# Patient Record
Sex: Male | Born: 1960 | Race: White | Hispanic: No | Marital: Married | Smoking: Never smoker
Health system: Southern US, Community
[De-identification: ages and names within clinical notes are randomized; demographics above are authoritative.]

## PROBLEM LIST (undated history)

## (undated) DIAGNOSIS — Z950 Presence of cardiac pacemaker: Secondary | ICD-10-CM

## (undated) DIAGNOSIS — E785 Hyperlipidemia, unspecified: Secondary | ICD-10-CM

## (undated) DIAGNOSIS — E079 Disorder of thyroid, unspecified: Secondary | ICD-10-CM

## (undated) DIAGNOSIS — E119 Type 2 diabetes mellitus without complications: Secondary | ICD-10-CM

## (undated) DIAGNOSIS — E7800 Pure hypercholesterolemia, unspecified: Secondary | ICD-10-CM

## (undated) DIAGNOSIS — I10 Essential (primary) hypertension: Secondary | ICD-10-CM

## (undated) HISTORY — PX: ABDOMINAL SURGERY: SHX537

## (undated) HISTORY — PX: PACEMAKER INSERTION: SHX728

## (undated) HISTORY — PX: APPENDECTOMY: SHX54

## (undated) HISTORY — PX: HAND SURGERY: SHX662

---

## 2010-03-25 ENCOUNTER — Emergency Department (HOSPITAL_BASED_OUTPATIENT_CLINIC_OR_DEPARTMENT_OTHER): Admission: EM | Admit: 2010-03-25 | Discharge: 2010-03-25 | Payer: Self-pay | Admitting: Emergency Medicine

## 2010-03-25 ENCOUNTER — Ambulatory Visit: Payer: Self-pay | Admitting: Radiology

## 2011-12-19 IMAGING — CR DG ANKLE COMPLETE 3+V*R*
3 series · 3 of 3 positions shown · non-contrast
Comparison: None.

CLINICAL DATA: Twisted right ankle, pain

RIGHT ANKLE - COMPLETE 3+ VIEW

[t ankle joint ap right]
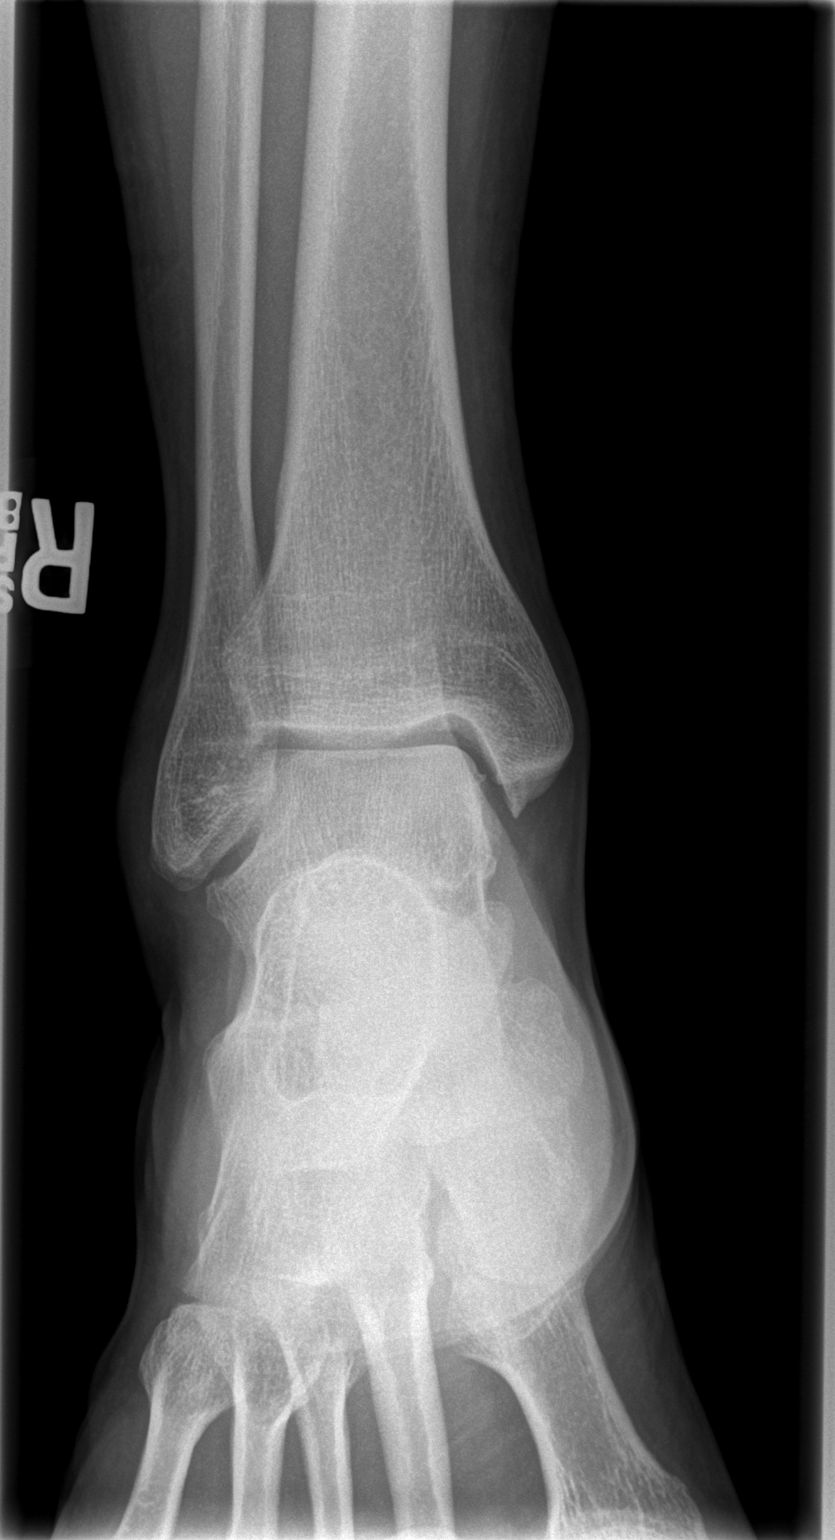

[t ankle joint oblique right]
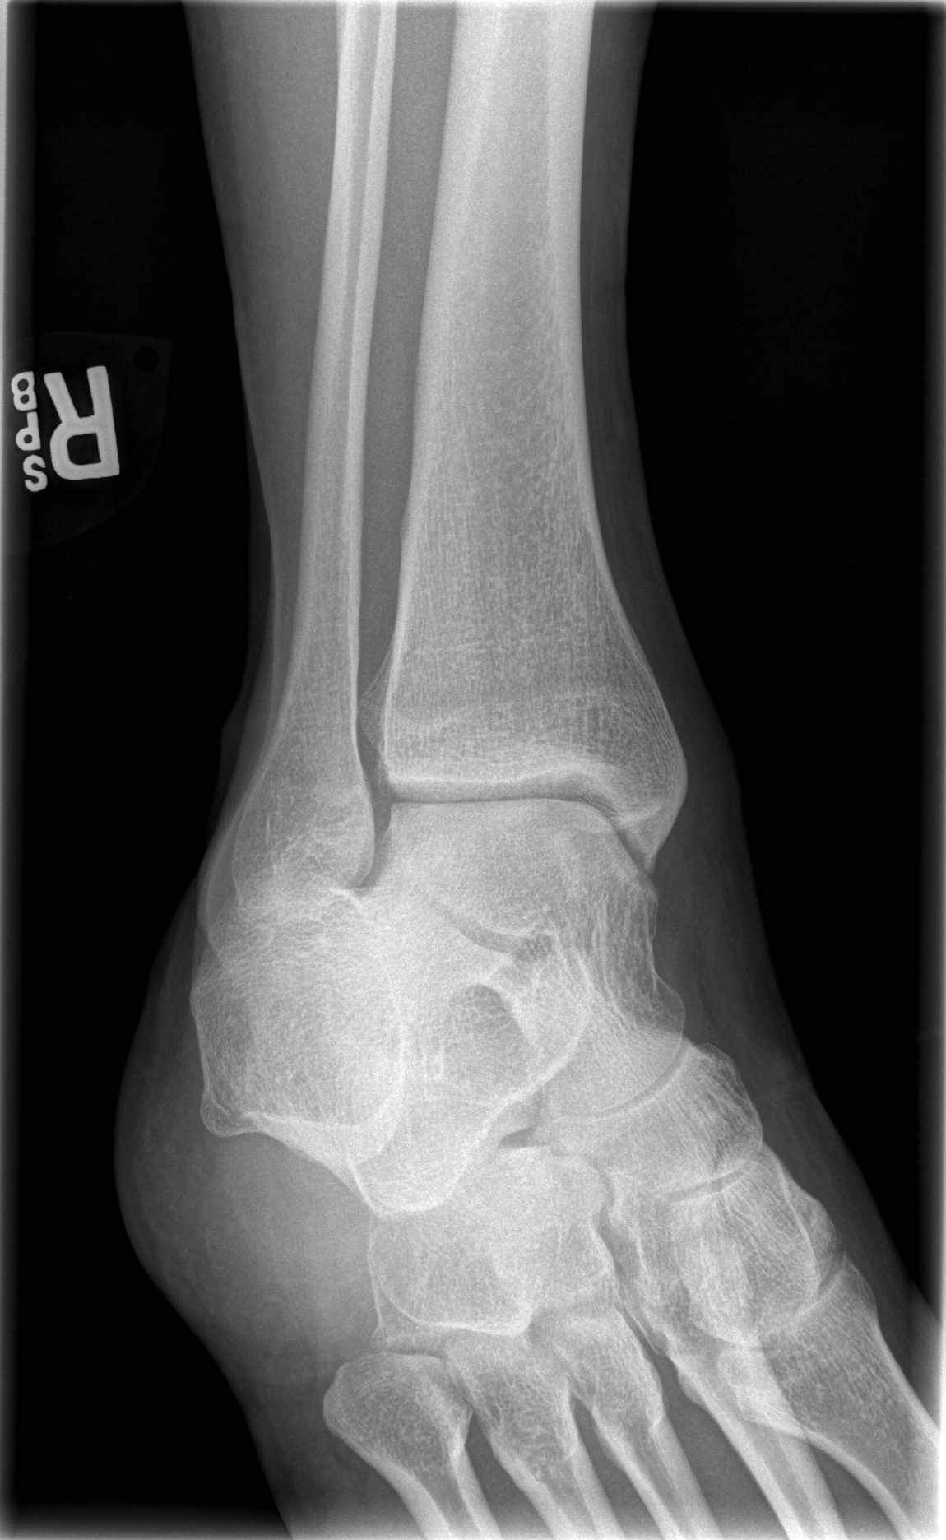

[t ankle joint lat right]
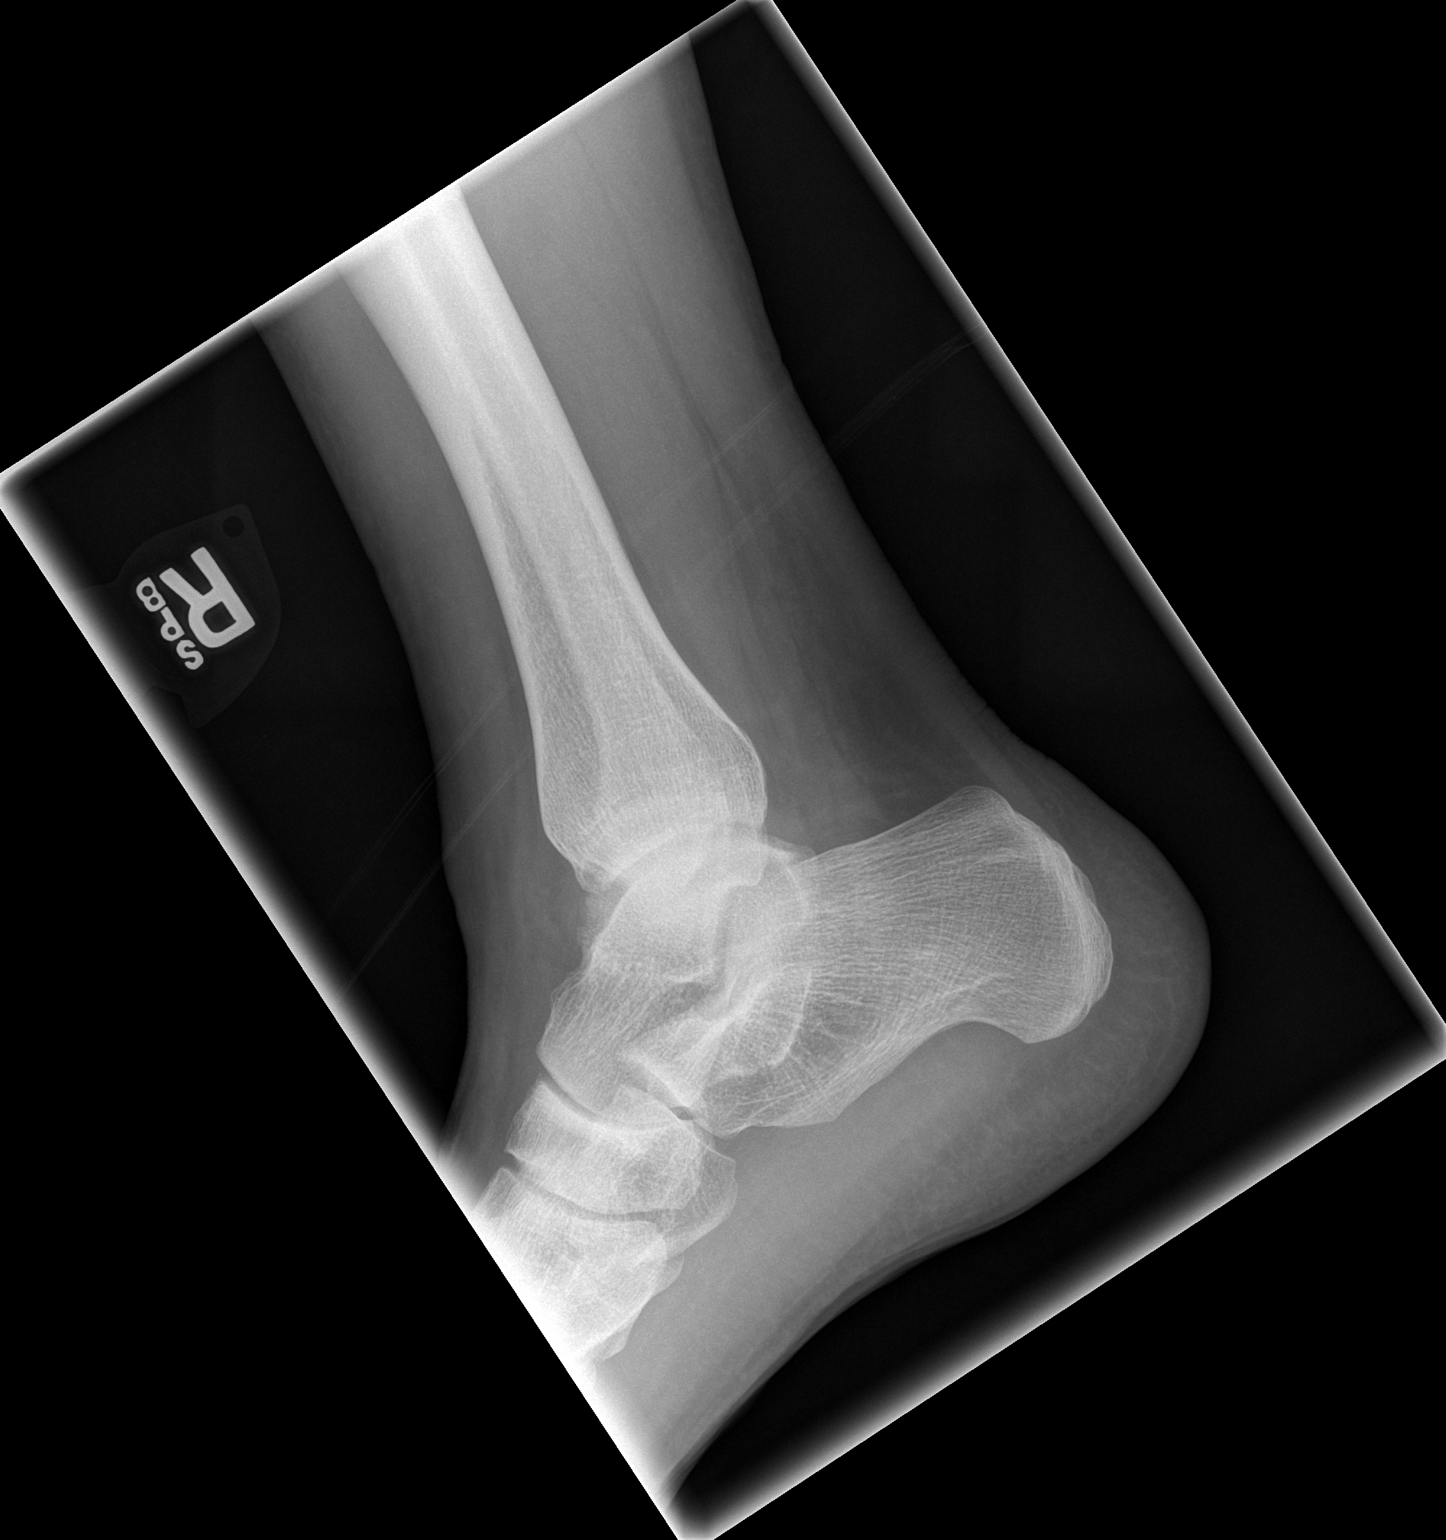

[3 of 3 positions shown; findings below may reference images not displayed]

FINDINGS: Mild soft tissue swelling.  No fracture or dislocation.
IMPRESSION: As above.

## 2014-09-17 ENCOUNTER — Encounter (HOSPITAL_BASED_OUTPATIENT_CLINIC_OR_DEPARTMENT_OTHER): Payer: Self-pay | Admitting: Emergency Medicine

## 2014-09-17 ENCOUNTER — Emergency Department (HOSPITAL_BASED_OUTPATIENT_CLINIC_OR_DEPARTMENT_OTHER)
Admission: EM | Admit: 2014-09-17 | Discharge: 2014-09-17 | Disposition: A | Discharge status: Home / Self Care | Payer: BC Managed Care – PPO | Attending: Emergency Medicine | Admitting: Emergency Medicine

## 2014-09-17 DIAGNOSIS — Z950 Presence of cardiac pacemaker: Secondary | ICD-10-CM | POA: Diagnosis not present

## 2014-09-17 DIAGNOSIS — I10 Essential (primary) hypertension: Secondary | ICD-10-CM | POA: Diagnosis not present

## 2014-09-17 DIAGNOSIS — E079 Disorder of thyroid, unspecified: Secondary | ICD-10-CM | POA: Insufficient documentation

## 2014-09-17 DIAGNOSIS — Z79899 Other long term (current) drug therapy: Secondary | ICD-10-CM | POA: Insufficient documentation

## 2014-09-17 DIAGNOSIS — M25511 Pain in right shoulder: Secondary | ICD-10-CM | POA: Diagnosis present

## 2014-09-17 DIAGNOSIS — E119 Type 2 diabetes mellitus without complications: Secondary | ICD-10-CM | POA: Insufficient documentation

## 2014-09-17 DIAGNOSIS — M542 Cervicalgia: Secondary | ICD-10-CM | POA: Diagnosis not present

## 2014-09-17 DIAGNOSIS — E780 Pure hypercholesterolemia: Secondary | ICD-10-CM | POA: Insufficient documentation

## 2014-09-17 HISTORY — DX: Type 2 diabetes mellitus without complications: E11.9

## 2014-09-17 HISTORY — DX: Disorder of thyroid, unspecified: E07.9

## 2014-09-17 HISTORY — DX: Presence of cardiac pacemaker: Z95.0

## 2014-09-17 HISTORY — DX: Pure hypercholesterolemia, unspecified: E78.00

## 2014-09-17 HISTORY — DX: Essential (primary) hypertension: I10

## 2014-09-17 MED ORDER — HYDROCODONE-ACETAMINOPHEN 5-325 MG PO TABS: 1.0000 | ORAL_TABLET | ORAL | Status: AC | PRN

## 2014-09-17 NOTE — ED Provider Notes (Signed)
CSN: [id number]     Arrival date & time 09/17/14  1708 History   First MD Initiated Contact with Patient 09/17/14 1720     Chief Complaint  Patient presents with  . Shoulder Pain     (Consider location/radiation/quality/duration/timing/severity/associated sxs/prior Treatment) Patient is a [age] y.o. male presenting with shoulder pain. The history is provided by the patient. No language interpreter was used.  Shoulder Pain This is a new problem. The current episode started today. The problem occurs constantly. Pertinent negatives include no chest pain, chills, fever, numbness or weakness. Associated symptoms comments: Right shoulder pain without known injury that started this morning. Pain affects the paracervical neck, radiates to parascapular area and along superior shoulder. No weakness, numbness or swelling. He denies chest pain, SOB or headache. The pain is worse with movement and better with rest. .    Past Medical History  Diagnosis Date  . Pacemaker   . Hypertension   . Diabetes mellitus without complication   . Hypercholesteremia   . Thyroid disease    Past Surgical History  Procedure Laterality Date  . Appendectomy    . Abdominal surgery    . Hand surgery     History reviewed. No pertinent family history. History  Substance Use Topics  . Smoking status: Never Smoker   . Smokeless tobacco: Not on file  . Alcohol Use: No    Review of Systems  Constitutional: Negative for fever and chills.  Respiratory: Negative.  Negative for shortness of breath.   Cardiovascular: Negative.  Negative for chest pain.  Musculoskeletal:       See HPI  Skin: Negative.   Neurological: Negative.  Negative for weakness and numbness.      Allergies  Erythromycin  Home Medications   Prior to Admission medications   Medication Sig Start Date End Date Taking? Authorizing Provider  glipiZIDE (GLUCOTROL) 10 MG tablet Take 10 mg by mouth daily before breakfast.   Yes Historical  Provider, MD  lansoprazole (PREVACID) 30 MG capsule Take 30 mg by mouth daily at 12 noon.   Yes Historical Provider, MD  Levothyroxine Sodium (SYNTHROID PO) Take by mouth.   Yes Historical Provider, MD  lisinopril (PRINIVIL,ZESTRIL) 40 MG tablet Take 40 mg by mouth daily.   Yes Historical Provider, MD  metFORMIN (GLUMETZA) 1000 MG (MOD) 24 hr tablet Take 1,000 mg by mouth 2 (two) times daily with a meal.   Yes Historical Provider, MD  pravastatin (PRAVACHOL) 40 MG tablet Take 40 mg by mouth daily.   Yes Historical Provider, MD   BP 127/86  Pulse 71  Temp(Src) 98.5 F (36.9 C) (Oral)  Resp 18  SpO2 100% Physical Exam  Constitutional: He is oriented to person, place, and time. He appears well-developed and well-nourished.  Neck: Normal range of motion.  Pulmonary/Chest: Effort normal.  Abdominal: Soft.  Musculoskeletal: Normal range of motion.  Minimal tenderness to palpation of right paracervical neck and shoulder. No swelling, discoloration or deformity. FROM. 5/5 strength of right UE.  Neurological: He is alert and oriented to person, place, and time. Coordination normal.  Skin: Skin is warm and dry.  Psychiatric: He has a normal mood and affect.    ED Course  Procedures (including critical care time) Labs Review Labs Reviewed - No data to display  Imaging Review No results found.   EKG Interpretation None      MDM   Final diagnoses:  None    1. Right shoulder pain  Muscular pain vs radiculopathy,  favor muscular pain. No neurologic deficits. Will treat symptomatically and encourage ortho follow up.    Arnoldo HookerShari A Alice Vitelli, PA-C 09/17/14 1802  Rolland PorterMark James, MD 09/24/14 [phone]

## 2014-09-17 NOTE — ED Notes (Signed)
Pt reports right shoulder pain that started today and is worse when he moves his arm

## 2014-09-17 NOTE — ED Notes (Signed)
PA at bedside.

## 2014-09-17 NOTE — Discharge Instructions (Signed)
Shoulder Pain  The shoulder is the joint that connects your arms to your body. The bones that form the shoulder joint include the upper arm bone (humerus), the shoulder blade (scapula), and the collarbone (clavicle). The top of the humerus is shaped like a ball and fits into a rather flat socket on the scapula (glenoid cavity). A combination of muscles and strong, fibrous tissues that connect muscles to bones (tendons) support your shoulder joint and hold the ball in the socket. Small, fluid-filled sacs (bursae) are located in different areas of the joint. They act as cushions between the bones and the overlying soft tissues and help reduce friction between the gliding tendons and the bone as you move your arm. Your shoulder joint allows a wide range of motion in your arm. This range of motion allows you to do things like scratch your back or throw a ball. However, this range of motion also makes your shoulder more prone to pain from overuse and injury.  Causes of shoulder pain can originate from both injury and overuse and usually can be grouped in the following four categories:   Redness, swelling, and pain (inflammation) of the tendon (tendinitis) or the bursae (bursitis).   Instability, such as a dislocation of the joint.   Inflammation of the joint (arthritis).   Broken bone (fracture).  HOME CARE INSTRUCTIONS    Apply ice to the sore area.   Put ice in a plastic bag.   Place a towel between your skin and the bag.   Leave the ice on for 15-20 minutes, 3-4 times per day for the first 2 days, or as directed by your health care provider.   Stop using cold packs if they do not help with the pain.   If you have a shoulder sling or immobilizer, wear it as long as your caregiver instructs. Only remove it to shower or bathe. Move your arm as little as possible, but keep your hand moving to prevent swelling.   Squeeze a soft ball or foam pad as much as possible to help prevent swelling.   Only take  over-the-counter or prescription medicines for pain, discomfort, or fever as directed by your caregiver.  SEEK MEDICAL CARE IF:    Your shoulder pain increases, or new pain develops in your arm, hand, or fingers.   Your hand or fingers become cold and numb.   Your pain is not relieved with medicines.  SEEK IMMEDIATE MEDICAL CARE IF:    Your arm, hand, or fingers are numb or tingling.   Your arm, hand, or fingers are significantly swollen or turn white or blue.  MAKE SURE YOU:    Understand these instructions.   Will watch your condition.   Will get help right away if you are not doing well or get worse.  Document Released: 08/15/2005 Document Revised: 03/22/2014 Document Reviewed: 10/20/2011  Exit[employer] Patient Information 2015 [employer]. This information is not intended to replace advice given to you by your health care provider. Make sure you discuss any questions you have with your health care provider.  Cryotherapy  Cryotherapy means treatment with cold. Ice or gel packs can be used to reduce both pain and swelling. Ice is the most helpful within the first 24 to 48 hours after an injury or flare-up from overusing a muscle or joint. Sprains, strains, spasms, burning pain, shooting pain, and aches can all be eased with ice. Ice can also be used when recovering from surgery. Ice is effective, has   very few side effects, and is safe for most people to use.  PRECAUTIONS   Ice is not a safe treatment option for people with:   Raynaud phenomenon. This is a condition affecting small blood vessels in the extremities. Exposure to cold may cause your problems to return.   Cold hypersensitivity. There are many forms of cold hypersensitivity, including:   Cold urticaria. Red, itchy hives appear on the skin when the tissues begin to warm after being iced.   Cold erythema. This is a red, itchy rash caused by exposure to cold.   Cold hemoglobinuria. Red blood cells break down when the tissues begin to warm after  being iced. The hemoglobin that carry oxygen are passed into the urine because they cannot combine with blood proteins fast enough.   Numbness or altered sensitivity in the area being iced.  If you have any of the following conditions, do not use ice until you have discussed cryotherapy with your caregiver:   Heart conditions, such as arrhythmia, angina, or chronic heart disease.   High blood pressure.   Healing wounds or open skin in the area being iced.   Current infections.   Rheumatoid arthritis.   Poor circulation.   Diabetes.  Ice slows the blood flow in the region it is applied. This is beneficial when trying to stop inflamed tissues from spreading irritating chemicals to surrounding tissues. However, if you expose your skin to cold temperatures for too long or without the proper protection, you can damage your skin or nerves. Watch for signs of skin damage due to cold.  HOME CARE INSTRUCTIONS  Follow these tips to use ice and cold packs safely.   Place a dry or damp towel between the ice and skin. A damp towel will cool the skin more quickly, so you may need to shorten the time that the ice is used.   For a more rapid response, add gentle compression to the ice.   Ice for no more than 10 to 20 minutes at a time. The bonier the area you are icing, the less time it will take to get the benefits of ice.   Check your skin after 5 minutes to make sure there are no signs of a poor response to cold or skin damage.   Rest 20 minutes or more between uses.   Once your skin is numb, you can end your treatment. You can test numbness by very lightly touching your skin. The touch should be so light that you do not see the skin dimple from the pressure of your fingertip. When using ice, most people will feel these normal sensations in this order: cold, burning, aching, and numbness.   Do not use ice on someone who cannot communicate their responses to pain, such as small children or people with  dementia.  HOW TO MAKE AN ICE PACK  Ice packs are the most common way to use ice therapy. Other methods include ice massage, ice baths, and cryosprays. Muscle creams that cause a cold, tingly feeling do not offer the same benefits that ice offers and should not be used as a substitute unless recommended by your caregiver.  To make an ice pack, do one of the following:   Place crushed ice or a bag of frozen vegetables in a sealable plastic bag. Squeeze out the excess air. Place this bag inside another plastic bag. Slide the bag into a pillowcase or place a damp towel between your skin and the bag.     Mix 3 parts water with 1 part rubbing alcohol. Freeze the mixture in a sealable plastic bag. When you remove the mixture from the freezer, it will be slushy. Squeeze out the excess air. Place this bag inside another plastic bag. Slide the bag into a pillowcase or place a damp towel between your skin and the bag.  SEEK MEDICAL CARE IF:   You develop white spots on your skin. This may give the skin a blotchy (mottled) appearance.   Your skin turns blue or pale.   Your skin becomes waxy or hard.   Your swelling gets worse.  MAKE SURE YOU:    Understand these instructions.   Will watch your condition.   Will get help right away if you are not doing well or get worse.  Document Released: 07/02/2011 Document Revised: 03/22/2014 Document Reviewed: 07/02/2011  [employer] Patient Information 2015 [employer]. This information is not intended to replace advice given to you by your health care provider. Make sure you discuss any questions you have with your health care provider.

## 2015-07-12 ENCOUNTER — Other Ambulatory Visit: Payer: Self-pay

## 2015-07-12 ENCOUNTER — Emergency Department (HOSPITAL_BASED_OUTPATIENT_CLINIC_OR_DEPARTMENT_OTHER)
Admission: EM | Admit: 2015-07-12 | Discharge: 2015-07-12 | Disposition: A | Discharge status: Home / Self Care | Payer: BLUE CROSS/BLUE SHIELD | Attending: Emergency Medicine | Admitting: Emergency Medicine

## 2015-07-12 ENCOUNTER — Encounter (HOSPITAL_BASED_OUTPATIENT_CLINIC_OR_DEPARTMENT_OTHER): Payer: Self-pay

## 2015-07-12 ENCOUNTER — Emergency Department (HOSPITAL_BASED_OUTPATIENT_CLINIC_OR_DEPARTMENT_OTHER): Payer: BLUE CROSS/BLUE SHIELD

## 2015-07-12 DIAGNOSIS — G43809 Other migraine, not intractable, without status migrainosus: Secondary | ICD-10-CM | POA: Insufficient documentation

## 2015-07-12 DIAGNOSIS — E119 Type 2 diabetes mellitus without complications: Secondary | ICD-10-CM | POA: Insufficient documentation

## 2015-07-12 DIAGNOSIS — E785 Hyperlipidemia, unspecified: Secondary | ICD-10-CM | POA: Diagnosis not present

## 2015-07-12 DIAGNOSIS — Z79899 Other long term (current) drug therapy: Secondary | ICD-10-CM | POA: Diagnosis not present

## 2015-07-12 DIAGNOSIS — I10 Essential (primary) hypertension: Secondary | ICD-10-CM | POA: Insufficient documentation

## 2015-07-12 DIAGNOSIS — R51 Headache: Secondary | ICD-10-CM | POA: Diagnosis present

## 2015-07-12 HISTORY — DX: Hyperlipidemia, unspecified: E78.5

## 2015-07-12 LAB — CBC WITH DIFFERENTIAL/PLATELET
BASOS ABS: 0 10*3/uL (ref 0.0–0.1)
BASOS PCT: 0 % (ref 0–1)
EOS PCT: 1 % (ref 0–5)
Eosinophils Absolute: 0.1 10*3/uL (ref 0.0–0.7)
HCT: 48.8 % (ref 39.0–52.0)
Hemoglobin: 16.8 g/dL (ref 13.0–17.0)
Lymphocytes Relative: 31 % (ref 12–46)
Lymphs Abs: 2.7 10*3/uL (ref 0.7–4.0)
MCH: 29.7 pg (ref 26.0–34.0)
MCHC: 34.4 g/dL (ref 30.0–36.0)
MCV: 86.2 fL (ref 78.0–100.0)
MONO ABS: 0.6 10*3/uL (ref 0.1–1.0)
MONOS PCT: 6 % (ref 3–12)
Neutro Abs: 5.4 10*3/uL (ref 1.7–7.7)
Neutrophils Relative %: 62 % (ref 43–77)
PLATELETS: 165 10*3/uL (ref 150–400)
RBC: 5.66 MIL/uL (ref 4.22–5.81)
RDW: 13.4 % (ref 11.5–15.5)
WBC: 8.8 10*3/uL (ref 4.0–10.5)

## 2015-07-12 LAB — COMPREHENSIVE METABOLIC PANEL
ALK PHOS: 119 U/L (ref 38–126)
ALT: 14 U/L — AB (ref 17–63)
AST: 20 U/L (ref 15–41)
Albumin: 4.4 g/dL (ref 3.5–5.0)
Anion gap: 12 (ref 5–15)
BILIRUBIN TOTAL: 0.7 mg/dL (ref 0.3–1.2)
BUN: 15 mg/dL (ref 6–20)
CALCIUM: 9.7 mg/dL (ref 8.9–10.3)
CO2: 23 mmol/L (ref 22–32)
CREATININE: 1.02 mg/dL (ref 0.61–1.24)
Chloride: 101 mmol/L (ref 101–111)
GFR calc Af Amer: >60 mL/min (ref >60)
GLUCOSE: 174 mg/dL — AB (ref 65–99)
POTASSIUM: 4 mmol/L (ref 3.5–5.1)
Sodium: 136 mmol/L (ref 135–145)
TOTAL PROTEIN: 7.7 g/dL (ref 6.5–8.1)

## 2015-07-12 LAB — URINE MICROSCOPIC-ADD ON

## 2015-07-12 LAB — URINALYSIS, ROUTINE W REFLEX MICROSCOPIC
BILIRUBIN URINE: NEGATIVE
Glucose, UA: >1000 mg/dL — AB
HGB URINE DIPSTICK: NEGATIVE
KETONES UR: NEGATIVE mg/dL
Leukocytes, UA: NEGATIVE
NITRITE: NEGATIVE
PH: 5.5 (ref 5.0–8.0)
Protein, ur: NEGATIVE mg/dL
SPECIFIC GRAVITY, URINE: 1.041 — AB (ref 1.005–1.030)
UROBILINOGEN UA: 0.2 mg/dL (ref 0.0–1.0)

## 2015-07-12 LAB — RAPID URINE DRUG SCREEN, HOSP PERFORMED
Amphetamines: NOT DETECTED
BARBITURATES: NOT DETECTED
Benzodiazepines: NOT DETECTED
COCAINE: NOT DETECTED
OPIATES: NOT DETECTED
TETRAHYDROCANNABINOL: NOT DETECTED

## 2015-07-12 LAB — ETHANOL

## 2015-07-12 MED ORDER — DIPHENHYDRAMINE HCL 50 MG/ML IJ SOLN
25.0000 mg | Freq: Once | INTRAMUSCULAR | Status: AC
  Administered 2015-07-12: 25 mg via INTRAVENOUS
  Filled 2015-07-12: qty 1

## 2015-07-12 MED ORDER — METOCLOPRAMIDE HCL 5 MG/ML IJ SOLN
10.0000 mg | Freq: Once | INTRAMUSCULAR | Status: AC
  Administered 2015-07-12: 10 mg via INTRAVENOUS
  Filled 2015-07-12: qty 2

## 2015-07-12 MED ORDER — DEXAMETHASONE SODIUM PHOSPHATE 10 MG/ML IJ SOLN
10.0000 mg | Freq: Once | INTRAMUSCULAR | Status: AC
  Administered 2015-07-12: 10 mg via INTRAVENOUS
  Filled 2015-07-12: qty 1

## 2015-07-12 MED ORDER — SODIUM CHLORIDE 0.9 % IV SOLN
1000.0000 mL | Freq: Once | INTRAVENOUS | Status: AC
  Administered 2015-07-12: 1000 mL via INTRAVENOUS

## 2015-07-12 MED ORDER — SODIUM CHLORIDE 0.9 % IV SOLN: 1000.0000 mL | INTRAVENOUS | Status: DC

## 2015-07-12 NOTE — ED Notes (Signed)
Patient transported to CT 

## 2015-07-12 NOTE — Discharge Instructions (Signed)

## 2015-07-12 NOTE — ED Notes (Signed)
Pt from home with [facility]. Sts HA x today. Denies nausea. Reports dizziness.

## 2015-07-12 NOTE — ED Provider Notes (Signed)
CSN: [id number]     Arrival date & time 07/12/15  1240 History   First MD Initiated Contact with Patient 07/12/15 1259     Chief Complaint  Patient presents with  . Headache     (Consider location/radiation/quality/duration/timing/severity/associated sxs/prior Treatment) Patient is a 54 y.o. male presenting with headaches.  Headache Pain location:  R parietal Quality:  Dull Radiates to:  Does not radiate Severity currently:  10/10 Severity at highest:  10/10 Onset quality:  Gradual Duration:  6 hours Timing:  Constant Progression:  Unchanged Chronicity:  Recurrent Similar to prior headaches: yes   Context comment:  Spontaneous Relieved by:  Nothing Worsened by:  Activity and light Ineffective treatments:  None tried Associated symptoms: no abdominal pain, no ear pain, no near-syncope, no paresthesias, no sinus pressure and no URI     Past Medical History  Diagnosis Date  . Hypertension   . Diabetes mellitus without complication   . Hyperlipidemia    Past Surgical History  Procedure Laterality Date  . Pacemaker insertion     History reviewed. No pertinent family history. Social History  Substance Use Topics  . Smoking status: Never Smoker   . Smokeless tobacco: None  . Alcohol Use: No    Review of Systems  HENT: Negative for ear pain and sinus pressure.   Cardiovascular: Negative for near-syncope.  Gastrointestinal: Negative for abdominal pain.  Neurological: Positive for headaches. Negative for paresthesias.  All other systems reviewed and are negative.     Allergies  Erythromycin  Home Medications   Prior to Admission medications   Medication Sig Start Date End Date Taking? Authorizing Provider  levothyroxine (SYNTHROID, LEVOTHROID) 100 MCG tablet Take 100 mcg by mouth daily before breakfast.   Yes Historical Provider, MD  lisinopril (PRINIVIL,ZESTRIL) 10 MG tablet Take 10 mg by mouth daily.   Yes Historical Provider, MD  metFORMIN (GLUCOPHAGE) 500  MG tablet Take by mouth 2 (two) times daily with a meal.   Yes Historical Provider, MD  naproxen sodium (ANAPROX) 220 MG tablet Take 220 mg by mouth 2 (two) times daily with a meal.   Yes Historical Provider, MD  pravastatin (PRAVACHOL) 10 MG tablet Take 10 mg by mouth daily.   Yes Historical Provider, MD   BP 136/74 mmHg  Pulse 62  Temp(Src) 97.8 F (36.6 C) (Oral)  Resp 24  SpO2 100% Physical Exam  Constitutional: He is oriented to person, place, and time. He appears well-developed and well-nourished.  HENT:  Head: Normocephalic and atraumatic.  Eyes: Conjunctivae and EOM are normal.  Neck: Normal range of motion. Neck supple.  Cardiovascular: Normal rate, regular rhythm and normal heart sounds.   Pulmonary/Chest: Effort normal and breath sounds normal. No respiratory distress.  Abdominal: He exhibits no distension. There is no tenderness. There is no rebound and no guarding.  Musculoskeletal: Normal range of motion.  Neurological: He is alert and oriented to person, place, and time. He has normal strength and normal reflexes. No cranial nerve deficit or sensory deficit.  Skin: Skin is warm and dry.  Vitals reviewed.   ED Course  Procedures (including critical care time) Labs Review Labs Reviewed  COMPREHENSIVE METABOLIC PANEL - Abnormal; Notable for the following:    Glucose, Bld 174 (*)    ALT 14 (*)    All other components within normal limits  URINALYSIS, ROUTINE W REFLEX MICROSCOPIC (NOT AT [facility]) - Abnormal; Notable for the following:    Specific Gravity, Urine 1.041 (*)    Glucose,  UA >1000 (*)    All other components within normal limits  ETHANOL  CBC WITH DIFFERENTIAL/PLATELET  URINE RAPID DRUG SCREEN, HOSP PERFORMED  URINE MICROSCOPIC-ADD ON    Imaging Review Ct Head Wo Contrast  07/12/2015   CLINICAL DATA:  Right-sided headaches since this morning with nausea and dizziness.  EXAM: CT HEAD WITHOUT CONTRAST  TECHNIQUE: Contiguous axial images were obtained from  the base of the skull through the vertex without intravenous contrast.  COMPARISON:  None.  FINDINGS: Ventricles are normal in size and configuration. There are no parenchymal masses or mass effect, no areas of abnormal parenchymal attenuation, no evidence of an infarct, no extra-axial masses or abnormal fluid collections, and no evidence of intracranial hemorrhage.  Visualized sinuses and mastoid air cells are clear. No skull lesion.  IMPRESSION: Normal unenhanced CT scan of the brain.   Electronically Signed   By: Amie Portland M.D.   On: 07/12/2015 13:44   I have personally reviewed and evaluated these images and lab results as part of my medical decision-making.   EKG Interpretation   Date/Time:  Tuesday July 12 2015 12:49:33 EDT Ventricular Rate:  72 PR Interval:  176 QRS Duration: 102 QT Interval:  426 QTC Calculation: 466 R Axis:   74 Text Interpretation:  Atrial-paced rhythm Abnormal ECG No old tracing to  compare Confirmed by Mirian Mo [phone]) on 07/12/2015 1:12:58 PM      MDM   Final diagnoses:  Other migraine without status migrainosus, not intractable    54 y.o. male with pertinent PMH of migraine, DM, HTN presents with recurrent headache. Patient states headache is different in that it is more localized on the right. Headache was gradual onset, no meningitic signs on exam. No focal neurodeficits on exam. Given migraine cocktail with relief of symptoms. Discharged home in stable condition.    I have reviewed all laboratory and imaging studies if ordered as above  1. Other migraine without status migrainosus, not intractable         Mirian Mo, MD 07/13/15 [phone]

## 2015-07-12 NOTE — ED Notes (Signed)
Patient placed on cardiac monitor.

## 2015-07-14 ENCOUNTER — Encounter (HOSPITAL_BASED_OUTPATIENT_CLINIC_OR_DEPARTMENT_OTHER): Payer: Self-pay | Admitting: Emergency Medicine
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
## 2022-11-02 ENCOUNTER — Other Ambulatory Visit (INDEPENDENT_AMBULATORY_CARE_PROVIDER_SITE_OTHER): Payer: No Typology Code available for payment source

## 2022-11-02 ENCOUNTER — Ambulatory Visit (INDEPENDENT_AMBULATORY_CARE_PROVIDER_SITE_OTHER): Payer: No Typology Code available for payment source | Admitting: Physician Assistant

## 2022-11-02 ENCOUNTER — Encounter: Payer: Self-pay | Admitting: Physician Assistant

## 2022-11-02 VITALS — BP 134/83 | HR 74 | Resp 18 | Ht 74.0 in | Wt 183.0 lb

## 2022-11-02 DIAGNOSIS — R413 Other amnesia: Secondary | ICD-10-CM | POA: Diagnosis not present

## 2022-11-02 DIAGNOSIS — R0989 Other specified symptoms and signs involving the circulatory and respiratory systems: Secondary | ICD-10-CM | POA: Diagnosis not present

## 2022-11-02 DIAGNOSIS — R404 Transient alteration of awareness: Secondary | ICD-10-CM | POA: Diagnosis not present

## 2022-11-02 LAB — TSH: TSH: 25.36 u[IU]/mL — ABNORMAL HIGH (ref 0.35–5.50)

## 2022-11-02 LAB — VITAMIN B12: Vitamin B-12: 646 pg/mL (ref 211–911)

## 2022-11-02 NOTE — Patient Instructions (Signed)
It was a pleasure to see you today at our office.   Recommendations:  Neurocognitive evaluation at outside facilit*y MRI of the brain, the radiology office will call you to arrange you appointment Check labs today EEG Carotid Ultrasound  Follow up in 6 weeks to discuss the results   Whom to call:  Memory  decline, memory medications: Call our office [phone]   For psychiatric meds, mood meds: Please have your primary care physician manage these medications.      For assessment of decision of mental capacity and competency:  Call Dr. Erick Blinks, geriatric psychiatrist at [phone]  For guidance in geriatric dementia issues please call [facility] [phone]    If you have any severe symptoms of a stroke, or other severe issues such as confusion,severe chills or fever, etc call 911 or go to the       RECOMMENDATIONS FOR ALL PATIENTS WITH MEMORY PROBLEMS: 1. Continue to exercise (Recommend 30 minutes of walking everyday, or 3 hours every week) 2. Increase social interactions - continue going to [place] and enjoy social gatherings with friends and family 3. Eat healthy, avoid fried foods and eat more fruits and vegetables 4. Maintain adequate blood pressure, blood sugar, and blood cholesterol level. Reducing the risk of stroke and cardiovascular disease also helps promoting better memory. 5. Avoid stressful situations. Live a simple life and avoid aggravations. Organize your time and prepare for the next day in anticipation. 6. Sleep well, avoid any interruptions of sleep and avoid any distractions in the bedroom that may interfere with adequate sleep quality 7. Avoid sugar, avoid sweets as there is a strong link between excessive sugar intake, diabetes, and cognitive impairment We discussed the Mediterranean diet, which has been shown to help patients reduce the risk of progressive memory disorders and reduces cardiovascular risk. This includes eating fish, eat  fruits and green leafy vegetables, nuts like almonds and hazelnuts, walnuts, and also use olive oil. Avoid fast foods and fried foods as much as possible. Avoid sweets and sugar as sugar use has been linked to worsening of memory function.  There is always a concern of gradual progression of memory problems. If this is the case, then we may need to adjust level of care according to patient needs. Support, both to the patient and caregiver, should then be put into place.      You have been referred for a neuropsychological evaluation (i.e., evaluation of memory and thinking abilities). Please bring someone with you to this appointment if possible, as it is helpful for the doctor to hear from both you and another adult who knows you well. Please bring eyeglasses and hearing aids if you wear them.    The evaluation will take approximately 3 hours and has two parts:   The first part is a clinical interview with the neuropsychologist (Dr. Milbert Coulter or Dr. Roseanne Reno). During the interview, the neuropsychologist will speak with you and the individual you brought to the appointment.    The second part of the evaluation is testing with the doctor's technician Annabelle Harman or Selena Batten). During the testing, the technician will ask you to remember different types of material, solve problems, and answer some questionnaires. Your family member will not be present for this portion of the evaluation.   Please note: We must reserve several hours of the neuropsychologist's time and the psychometrician's time for your evaluation appointment. As such, there is a No-Show fee of $100. If you are unable to attend any of  your appointments, please contact our office as soon as possible to reschedule.    FALL PRECAUTIONS: Be cautious when walking. Scan the area for obstacles that may increase the risk of trips and falls. When getting up in the mornings, sit up at the edge of the bed for a few minutes before getting out of bed. Consider  elevating the bed at the head end to avoid drop of blood pressure when getting up. Walk always in a well-lit room (use night lights in the walls). Avoid area rugs or power cords from appliances in the middle of the walkways. Use a walker or a cane if necessary and consider physical therapy for balance exercise. Get your eyesight checked regularly.  FINANCIAL OVERSIGHT: Supervision, especially oversight when making financial decisions or transactions is also recommended.  HOME SAFETY: Consider the safety of the kitchen when operating appliances like stoves, microwave oven, and blender. Consider having supervision and share cooking responsibilities until no longer able to participate in those. Accidents with firearms and other hazards in the house should be identified and addressed as well.   ABILITY TO BE LEFT ALONE: If patient is unable to contact 911 operator, consider using [facility], or when the need is there, arrange for someone to stay with patients. Smoking is a fire hazard, consider supervision or cessation. Risk of wandering should be assessed by caregiver and if detected at any point, supervision and safe proof recommendations should be instituted.  MEDICATION SUPERVISION: Inability to self-administer medication needs to be constantly addressed. Implement a mechanism to ensure safe administration of the medications.   DRIVING: Regarding driving, in patients with progressive memory problems, driving will be impaired. We advise to have someone else do the driving if trouble finding directions or if minor accidents are reported. Independent driving assessment is available to determine safety of driving.   If you are interested in the driving assessment, you can contact the following:  [employer] [place] [phone]  [facility] [phone]  [facility] [phone]  [facility] [phone] or [phone]    Mediterranean  Diet A Mediterranean diet refers to food and lifestyle choices that are based on the traditions of countries located on the [place]. This way of eating has been shown to help prevent certain conditions and improve outcomes for people who have chronic diseases, like kidney disease and heart disease. What are tips for following this plan? Lifestyle  Cook and eat meals together with your family, when possible. Drink enough fluid to keep your urine clear or pale yellow. Be physically active every day. This includes: Aerobic exercise like running or swimming. Leisure activities like gardening, walking, or housework. Get 7-8 hours of sleep each night. If recommended by your health care provider, drink red wine in moderation. This means 1 glass a day for nonpregnant women and 2 glasses a day for men. A glass of wine equals 5 oz (150 mL). Reading food labels  Check the serving size of packaged foods. For foods such as rice and pasta, the serving size refers to the amount of cooked product, not dry. Check the total fat in packaged foods. Avoid foods that have saturated fat or trans fats. Check the ingredients list for added sugars, such as corn syrup. Shopping  At the grocery store, buy most of your food from the areas near the walls of the store. This includes: Fresh fruits and vegetables (produce). Grains, beans, nuts, and seeds. Some of these may be available in unpackaged forms  or large amounts (in bulk). Fresh seafood. Poultry and eggs. Low-fat dairy products. Buy whole ingredients instead of prepackaged foods. Buy fresh fruits and vegetables in-season from local farmers markets. Buy frozen fruits and vegetables in resealable bags. If you do not have access to quality fresh seafood, buy precooked frozen shrimp or canned fish, such as tuna, salmon, or sardines. Buy small amounts of raw or cooked vegetables, salads, or olives from the deli or salad bar at your store. Stock your pantry  so you always have certain foods on hand, such as olive oil, canned tuna, canned tomatoes, rice, pasta, and beans. Cooking  Cook foods with extra-virgin olive oil instead of using butter or other vegetable oils. Have meat as a side dish, and have vegetables or grains as your main dish. This means having meat in small portions or adding small amounts of meat to foods like pasta or stew. Use beans or vegetables instead of meat in common dishes like chili or lasagna. Experiment with different cooking methods. Try roasting or broiling vegetables instead of steaming or sauteing them. Add frozen vegetables to soups, stews, pasta, or rice. Add nuts or seeds for added healthy fat at each meal. You can add these to yogurt, salads, or vegetable dishes. Marinate fish or vegetables using olive oil, lemon juice, garlic, and fresh herbs. Meal planning  Plan to eat 1 vegetarian meal one day each week. Try to work up to 2 vegetarian meals, if possible. Eat seafood 2 or more times a week. Have healthy snacks readily available, such as: Vegetable sticks with hummus. Greek yogurt. Fruit and nut trail mix. Eat balanced meals throughout the week. This includes: Fruit: 2-3 servings a day Vegetables: 4-5 servings a day Low-fat dairy: 2 servings a day Fish, poultry, or lean meat: 1 serving a day Beans and legumes: 2 or more servings a week Nuts and seeds: 1-2 servings a day Whole grains: 6-8 servings a day Extra-virgin olive oil: 3-4 servings a day Limit red meat and sweets to only a few servings a month What are my food choices? Mediterranean diet Recommended Grains: Whole-grain pasta. Brown rice. Bulgar wheat. Polenta. Couscous. Whole-wheat bread. Orpah Cobb. Vegetables: Artichokes. Beets. Broccoli. Cabbage. Carrots. Eggplant. Green beans. Chard. Kale. Spinach. Onions. Leeks. Peas. Squash. Tomatoes. Peppers. Radishes. Fruits: Apples. Apricots. Avocado. Berries. Bananas. Cherries. Dates. Figs. Grapes.  Lemons. Melon. Oranges. Peaches. Plums. Pomegranate. Meats and other protein foods: Beans. Almonds. Sunflower seeds. Pine nuts. Peanuts. Cod. Salmon. Scallops. Shrimp. Tuna. Tilapia. Clams. Oysters. Eggs. Dairy: Low-fat milk. Cheese. Greek yogurt. Beverages: Water. Red wine. Herbal tea. Fats and oils: Extra virgin olive oil. Avocado oil. Grape seed oil. Sweets and desserts: [place] yogurt with honey. Baked apples. Poached pears. Trail mix. Seasoning and other foods: Basil. Cilantro. Coriander. Cumin. Mint. Parsley. Sage. Rosemary. Tarragon. Garlic. Oregano. Thyme. Pepper. Balsalmic vinegar. Tahini. Hummus. Tomato sauce. Olives. Mushrooms. Limit these Grains: Prepackaged pasta or rice dishes. Prepackaged cereal with added sugar. Vegetables: Deep fried potatoes (french fries). Fruits: Fruit canned in syrup. Meats and other protein foods: Beef. Pork. Lamb. Poultry with skin. Hot dogs. Tomasa Blase. Dairy: Ice cream. Sour cream. Whole milk. Beverages: Juice. Sugar-sweetened soft drinks. Beer. Liquor and spirits. Fats and oils: Butter. Canola oil. Vegetable oil. Beef fat (tallow). Lard. Sweets and desserts: Cookies. Cakes. Pies. Candy. Seasoning and other foods: Mayonnaise. Premade sauces and marinades. The items listed may not be a complete list. Talk with your dietitian about what dietary choices are right for you. Summary The Mediterranean diet includes both  food and lifestyle choices. Eat a variety of fresh fruits and vegetables, beans, nuts, seeds, and whole grains. Limit the amount of red meat and sweets that you eat. Talk with your health care provider about whether it is safe for you to drink red wine in moderation. This means 1 glass a day for nonpregnant women and 2 glasses a day for men. A glass of wine equals 5 oz (150 mL). This information is not intended to replace advice given to you by your health care provider. Make sure you discuss any questions you have with your health care  provider. Document Released: 06/28/2016 Document Revised: 07/31/2016 Document Reviewed: 06/28/2016 Elsevier Interactive Patient Education  2017 [employer].

## 2022-11-05 ENCOUNTER — Telehealth: Payer: Self-pay

## 2022-11-05 NOTE — Telephone Encounter (Signed)
Referral for neuropsych. [facility] [phone] can't take patients insurance, not in network. Please advise.

## 2022-11-07 ENCOUNTER — Encounter: Payer: Self-pay | Admitting: Physician Assistant

## 2022-11-07 ENCOUNTER — Other Ambulatory Visit: Payer: Non-veteran care

## 2022-11-07 DIAGNOSIS — Z029 Encounter for administrative examinations, unspecified: Secondary | ICD-10-CM

## 2022-11-13 ENCOUNTER — Telehealth: Payer: Self-pay | Admitting: Physician Assistant

## 2022-11-13 NOTE — Telephone Encounter (Signed)
Patient states that we referred him out for some testing with a neuropsych provider and they do not take his insurance and it was going to be 1500.00 dollars he did not make the appt due to the cost please send to somewhere that would take his ins

## 2022-11-14 ENCOUNTER — Ambulatory Visit (INDEPENDENT_AMBULATORY_CARE_PROVIDER_SITE_OTHER): Payer: No Typology Code available for payment source | Admitting: Neurology

## 2022-11-14 DIAGNOSIS — R413 Other amnesia: Secondary | ICD-10-CM | POA: Diagnosis not present

## 2022-11-14 NOTE — Progress Notes (Signed)
EEG complete - results pending 

## 2022-11-20 NOTE — Procedures (Signed)
ELECTROENCEPHALOGRAM REPORT  Date of Study: 11/14/2022  Patient's Name: Samuel Copeland MRN: [id number] Date of Birth: [birth year]  Referring Provider: Sharene Butters, PA-C  Clinical History: This is a 62 year old man with episodes of confusion in the morning, staring, feeling disoriented for a few seconds. EEG for classification.  Medications: Glipizide, Metformin, Lisinopril, Pravastatin  Technical Summary: A multichannel digital 1-hour EEG recording measured by the international 10-20 system with electrodes applied with paste and impedances below 5000 ohms performed in our laboratory with EKG monitoring in an awake and asleep patient.  Hyperventilation was not performed. Photic stimulation was performed.  The digital EEG was referentially recorded, reformatted, and digitally filtered in a variety of bipolar and referential montages for optimal display.    Description: The patient is awake and asleep during the recording.  During maximal wakefulness, there is a symmetric, medium voltage 9 Hz posterior dominant rhythm that attenuates with eye opening.  The record is symmetric.  During drowsiness and sleep, there is an increase in theta slowing of the background.  Vertex waves and symmetric sleep spindles were seen. Photic stimulation did not elicit any abnormalities.  There were no epileptiform discharges or electrographic seizures seen.    EKG lead was unremarkable.  Impression: This 1-hour awake and asleep EEG is normal.    Clinical Correlation: A normal EEG does not exclude a clinical diagnosis of epilepsy.  If further clinical questions remain, prolonged EEG may be helpful.  Clinical correlation is advised.   Ellouise Newer, M.D.

## 2022-11-23 ENCOUNTER — Ambulatory Visit
Admission: RE | Admit: 2022-11-23 | Discharge: 2022-11-23 | Disposition: A | Discharge status: Home / Self Care | Payer: Non-veteran care | Source: Ambulatory Visit | Attending: Physician Assistant | Admitting: Physician Assistant

## 2022-11-23 DIAGNOSIS — R0989 Other specified symptoms and signs involving the circulatory and respiratory systems: Secondary | ICD-10-CM

## 2022-11-26 NOTE — Progress Notes (Signed)
Ultrasound of the carotids is  mild narrowing  on the R, and mild to moderate  narrowing on the left . Follow up with the primary doctor.  Monitor the high blood pressure and cholesterol.

## 2022-11-27 ENCOUNTER — Telehealth: Payer: Self-pay | Admitting: Anesthesiology

## 2022-11-27 NOTE — Telephone Encounter (Signed)
Pt called stating he would like to speak nurse. He didn't give details. Requests call back

## 2022-11-28 NOTE — Progress Notes (Signed)
Left message at 2:24 11/28/2022

## 2022-11-28 NOTE — Telephone Encounter (Signed)
He is calling on results notes, will call

## 2022-11-30 NOTE — Telephone Encounter (Signed)
Pt called in returning Christy's call from yesterday

## 2022-12-03 NOTE — Telephone Encounter (Signed)
Is the cartoid ultrasound, I will call back.

## 2022-12-03 NOTE — Telephone Encounter (Signed)
Patient is in [place], death in family. He is thru the [place], he has a pacemaker. Will call back next week and see what is the best option

## 2022-12-14 ENCOUNTER — Ambulatory Visit (INDEPENDENT_AMBULATORY_CARE_PROVIDER_SITE_OTHER): Payer: No Typology Code available for payment source | Admitting: Physician Assistant

## 2022-12-14 ENCOUNTER — Encounter: Payer: Self-pay | Admitting: Physician Assistant

## 2022-12-14 VITALS — BP 111/80 | HR 79 | Resp 18 | Ht 74.0 in | Wt 181.0 lb

## 2022-12-14 DIAGNOSIS — R413 Other amnesia: Secondary | ICD-10-CM | POA: Diagnosis not present

## 2022-12-14 NOTE — Patient Instructions (Signed)
It was a pleasure to see you today at our office.   Recommendations:  Neurocognitive evaluation at outside facility, [place] Recommend MRI of the brain, if compatible with pacemaker . Call the [facility]  Follow up in 6 month Recommend continue Psychotherapy   Whom to call:  Memory  decline, memory medications: Call our office [phone]   For psychiatric meds, mood meds: Please have your primary care physician manage these medications.      For assessment of decision of mental capacity and competency:  Call Dr. Anthoney Harada, geriatric psychiatrist at [phone]  For guidance in geriatric dementia issues please call [facility] [phone]    If you have any severe symptoms of a stroke, or other severe issues such as confusion,severe chills or fever, etc call 911 or go to the       [place]: 1. Continue to exercise (Recommend 30 minutes of walking everyday, or 3 hours every week) 2. Increase social interactions - continue going to [place] and enjoy social gatherings with friends and family 3. Eat healthy, avoid fried foods and eat more fruits and vegetables 4. Maintain adequate blood pressure, blood sugar, and blood cholesterol level. Reducing the risk of stroke and cardiovascular disease also helps promoting better memory. 5. Avoid stressful situations. Live a simple life and avoid aggravations. Organize your time and prepare for the next day in anticipation. 6. Sleep well, avoid any interruptions of sleep and avoid any distractions in the bedroom that may interfere with adequate sleep quality 7. Avoid sugar, avoid sweets as there is a strong link between excessive sugar intake, diabetes, and cognitive impairment We discussed the Mediterranean diet, which has been shown to help patients reduce the risk of progressive memory disorders and reduces cardiovascular risk. This includes eating fish, eat fruits and green leafy vegetables,  nuts like almonds and hazelnuts, walnuts, and also use olive oil. Avoid fast foods and fried foods as much as possible. Avoid sweets and sugar as sugar use has been linked to worsening of memory function.  There is always a concern of gradual progression of memory problems. If this is the case, then we may need to adjust level of care according to patient needs. Support, both to the patient and caregiver, should then be put into place.      You have been referred for a neuropsychological evaluation (i.e., evaluation of memory and thinking abilities). Please bring someone with you to this appointment if possible, as it is helpful for the doctor to hear from both you and another adult who knows you well. Please bring eyeglasses and hearing aids if you wear them.    The evaluation will take approximately 3 hours and has two parts:   The first part is a clinical interview with the neuropsychologist (Dr. Melvyn Novas or Dr. Nicole Kindred). During the interview, the neuropsychologist will speak with you and the individual you brought to the appointment.    The second part of the evaluation is testing with the doctor's technician Hinton Dyer or Maudie Mercury). During the testing, the technician will ask you to remember different types of material, solve problems, and answer some questionnaires. Your family member will not be present for this portion of the evaluation.   Please note: We must reserve several hours of the neuropsychologist's time and the psychometrician's time for your evaluation appointment. As such, there is a No-Show fee of $100. If you are unable to attend any of your appointments, please contact our office as  soon as possible to reschedule.    FALL PRECAUTIONS: Be cautious when walking. Scan the area for obstacles that may increase the risk of trips and falls. When getting up in the mornings, sit up at the edge of the bed for a few minutes before getting out of bed. Consider elevating the bed at the head end to  avoid drop of blood pressure when getting up. Walk always in a well-lit room (use night lights in the walls). Avoid area rugs or power cords from appliances in the middle of the walkways. Use a walker or a cane if necessary and consider physical therapy for balance exercise. Get your eyesight checked regularly.  FINANCIAL OVERSIGHT: Supervision, especially oversight when making financial decisions or transactions is also recommended.  HOME SAFETY: Consider the safety of the kitchen when operating appliances like stoves, microwave oven, and blender. Consider having supervision and share cooking responsibilities until no longer able to participate in those. Accidents with firearms and other hazards in the house should be identified and addressed as well.   ABILITY TO BE LEFT ALONE: If patient is unable to contact 911 operator, consider using LifeLine, or when the need is there, arrange for someone to stay with patients. Smoking is a fire hazard, consider supervision or cessation. Risk of wandering should be assessed by caregiver and if detected at any point, supervision and safe proof recommendations should be instituted.  MEDICATION SUPERVISION: Inability to self-administer medication needs to be constantly addressed. Implement a mechanism to ensure safe administration of the medications.   DRIVING: Regarding driving, in patients with progressive memory problems, driving will be impaired. We advise to have someone else do the driving if trouble finding directions or if minor accidents are reported. Independent driving assessment is available to determine safety of driving.   If you are interested in the driving assessment, you can contact the following:  [employer] in [place]  [place] [phone] or [phone]    [place] refers to food  and lifestyle choices that are based on the traditions of countries located on the [employer]. This way of eating has been shown to help prevent certain conditions and improve outcomes for people who have chronic diseases, like kidney disease and heart disease. What are tips for following this plan? Lifestyle  Cook and eat meals together with your family, when possible. Drink enough fluid to keep your urine clear or pale yellow. Be physically active every day. This includes: Aerobic exercise like running or swimming. Leisure activities like gardening, walking, or housework. Get 7-8 hours of sleep each night. If recommended by your health care provider, drink red wine in moderation. This means 1 glass a day for nonpregnant women and 2 glasses a day for men. A glass of wine equals 5 oz (150 mL). Reading food labels  Check the serving size of packaged foods. For foods such as rice and pasta, the serving size refers to the amount of cooked product, not dry. Check the total fat in packaged foods. Avoid foods that have saturated fat or trans fats. Check the ingredients list for added sugars, such as corn syrup. Shopping  At the grocery store, buy most of your food from the areas near the walls of the store. This includes: Fresh fruits and vegetables (produce). Grains, beans, nuts, and seeds. Some of these may be available in unpackaged forms or large amounts (in bulk). Fresh seafood.  Poultry and eggs. Low-fat dairy products. Buy whole ingredients instead of prepackaged foods. Buy fresh fruits and vegetables in-season from local farmers markets. Buy frozen fruits and vegetables in resealable bags. If you do not have access to quality fresh seafood, buy precooked frozen shrimp or canned fish, such as tuna, salmon, or sardines. Buy small amounts of raw or cooked vegetables, salads, or olives from the deli or salad bar at your store. Stock your pantry so you always have certain foods on hand,  such as olive oil, canned tuna, canned tomatoes, rice, pasta, and beans. Cooking  Cook foods with extra-virgin olive oil instead of using butter or other vegetable oils. Have meat as a side dish, and have vegetables or grains as your main dish. This means having meat in small portions or adding small amounts of meat to foods like pasta or stew. Use beans or vegetables instead of meat in common dishes like chili or lasagna. Experiment with different cooking methods. Try roasting or broiling vegetables instead of steaming or sauteing them. Add frozen vegetables to soups, stews, pasta, or rice. Add nuts or seeds for added healthy fat at each meal. You can add these to yogurt, salads, or vegetable dishes. Marinate fish or vegetables using olive oil, lemon juice, garlic, and fresh herbs. Meal planning  Plan to eat 1 vegetarian meal one day each week. Try to work up to 2 vegetarian meals, if possible. Eat seafood 2 or more times a week. Have healthy snacks readily available, such as: Vegetable sticks with hummus. Greek yogurt. Fruit and nut trail mix. Eat balanced meals throughout the week. This includes: Fruit: 2-3 servings a day Vegetables: 4-5 servings a day Low-fat dairy: 2 servings a day Fish, poultry, or lean meat: 1 serving a day Beans and legumes: 2 or more servings a week Nuts and seeds: 1-2 servings a day Whole grains: 6-8 servings a day Extra-virgin olive oil: 3-4 servings a day Limit red meat and sweets to only a few servings a month What are my food choices? Mediterranean diet Recommended Grains: Whole-grain pasta. Brown rice. Bulgar wheat. Polenta. Couscous. Whole-wheat bread. [place]. Vegetables: Artichokes. Beets. Broccoli. Cabbage. Carrots. Eggplant. Green beans. Chard. Kale. Spinach. Onions. Leeks. Peas. Squash. Tomatoes. Peppers. Radishes. Fruits: Apples. Apricots. Avocado. Berries. Bananas. Cherries. Dates. Figs. Grapes. Lemons. Melon. Oranges. Peaches. Plums.  Pomegranate. Meats and other protein foods: Beans. Almonds. Sunflower seeds. Pine nuts. Peanuts. [place]. Salmon. Scallops. Shrimp. [place]. Tilapia. Clams. Oysters. Eggs. Dairy: Low-fat milk. Cheese. Greek yogurt. Beverages: Water. Red wine. Herbal tea. Fats and oils: Extra virgin olive oil. Avocado oil. Grape seed oil. Sweets and desserts: [place] yogurt with honey. Baked apples. Poached pears. Trail mix. Seasoning and other foods: Basil. Cilantro. Coriander. Cumin. Mint. Parsley. Sage. Rosemary. Tarragon. Garlic. Oregano. Thyme. Pepper. Balsalmic vinegar. Tahini. Hummus. Tomato sauce. Olives. Mushrooms. Limit these Grains: Prepackaged pasta or rice dishes. Prepackaged cereal with added sugar. Vegetables: Deep fried potatoes (french fries). Fruits: Fruit canned in syrup. Meats and other protein foods: Beef. Pork. Lamb. Poultry with skin. Hot dogs. Bernie[place] Salines. Dairy: Ice cream. Sour cream. Whole milk. Beverages: Juice. Sugar-sweetened soft drinks. Beer. Liquor and spirits. Fats and oils: Butter. Canola oil. Vegetable oil. Beef fat (tallow). Lard. Sweets and desserts: Cookies. Cakes. Pies. Candy. Seasoning and other foods: Mayonnaise. Premade sauces and marinades. The items listed may not be a complete list. Talk with your dietitian about what dietary choices are right for you. Summary The Mediterranean diet includes both food and lifestyle choices. Eat a variety  of fresh fruits and vegetables, beans, nuts, seeds, and whole grains. Limit the amount of red meat and sweets that you eat. Talk with your health care provider about whether it is safe for you to drink red wine in moderation. This means 1 glass a day for nonpregnant women and 2 glasses a day for men. A glass of wine equals 5 oz (150 mL). This information is not intended to replace advice given to you by your health care provider. Make sure you discuss any questions you have with your health care provider. Document Released: 06/28/2016 Document  Revised: 07/31/2016 Document Reviewed: 06/28/2016 Elsevier Interactive Patient Education  2017 [employer].
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
## 2023-06-18 ENCOUNTER — Ambulatory Visit (INDEPENDENT_AMBULATORY_CARE_PROVIDER_SITE_OTHER): Payer: Non-veteran care | Admitting: Physician Assistant

## 2023-06-18 ENCOUNTER — Encounter: Payer: Self-pay | Admitting: Physician Assistant

## 2023-06-18 VITALS — BP 134/79 | HR 70 | Resp 20 | Ht 74.0 in | Wt 179.0 lb

## 2023-06-18 DIAGNOSIS — R413 Other amnesia: Secondary | ICD-10-CM
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
## 2023-06-18 NOTE — Patient Instructions (Addendum)
It was a pleasure to see you today at our office.   Recommendations:  Neurocognitive evaluation at outside facility, [place] Recommend Ct head  Follow up in 6 month Recommend resume  Psychotherapy  Discontinue tobacco    Whom to call:  Memory  decline, memory medications: Call our office [phone]   For psychiatric meds, mood meds: Please have your primary care physician manage these medications.      For assessment of decision of mental capacity and competency:  Call Dr. Erick Blinks, geriatric psychiatrist at [phone]  For guidance in geriatric dementia issues please call [facility] [phone]    If you have any severe symptoms of a stroke, or other severe issues such as confusion,severe chills or fever, etc call 911 or go to the       RECOMMENDATIONS FOR ALL PATIENTS WITH MEMORY PROBLEMS: 1. Continue to exercise (Recommend 30 minutes of walking everyday, or 3 hours every week) 2. Increase social interactions - continue going to [place] and enjoy social gatherings with friends and family 3. Eat healthy, avoid fried foods and eat more fruits and vegetables 4. Maintain adequate blood pressure, blood sugar, and blood cholesterol level. Reducing the risk of stroke and cardiovascular disease also helps promoting better memory. 5. Avoid stressful situations. Live a simple life and avoid aggravations. Organize your time and prepare for the next day in anticipation. 6. Sleep well, avoid any interruptions of sleep and avoid any distractions in the bedroom that may interfere with adequate sleep quality 7. Avoid sugar, avoid sweets as there is a strong link between excessive sugar intake, diabetes, and cognitive impairment We discussed the Mediterranean diet, which has been shown to help patients reduce the risk of progressive memory disorders and reduces cardiovascular risk. This includes eating fish, eat fruits and green leafy vegetables, nuts like almonds and hazelnuts,  walnuts, and also use olive oil. Avoid fast foods and fried foods as much as possible. Avoid sweets and sugar as sugar use has been linked to worsening of memory function.  There is always a concern of gradual progression of memory problems. If this is the case, then we may need to adjust level of care according to patient needs. Support, both to the patient and caregiver, should then be put into place.        FALL PRECAUTIONS: Be cautious when walking. Scan the area for obstacles that may increase the risk of trips and falls. When getting up in the mornings, sit up at the edge of the bed for a few minutes before getting out of bed. Consider elevating the bed at the head end to avoid drop of blood pressure when getting up. Walk always in a well-lit room (use night lights in the walls). Avoid area rugs or power cords from appliances in the middle of the walkways. Use a walker or a cane if necessary and consider physical therapy for balance exercise. Get your eyesight checked regularly.  FINANCIAL OVERSIGHT: Supervision, especially oversight when making financial decisions or transactions is also recommended.  HOME SAFETY: Consider the safety of the kitchen when operating appliances like stoves, microwave oven, and blender. Consider having supervision and share cooking responsibilities until no longer able to participate in those. Accidents with firearms and other hazards in the house should be identified and addressed as well.   ABILITY TO BE LEFT ALONE: If patient is unable to contact 911 operator, consider using [facility], or when the need is there, arrange for someone to stay with patients.  Smoking is a fire hazard, consider supervision or cessation. Risk of wandering should be assessed by caregiver and if detected at any point, supervision and safe proof recommendations should be instituted.  MEDICATION SUPERVISION: Inability to self-administer medication needs to be constantly addressed.  Implement a mechanism to ensure safe administration of the medications.   DRIVING: Regarding driving, in patients with progressive memory problems, driving will be impaired. We advise to have someone else do the driving if trouble finding directions or if minor accidents are reported. Independent driving assessment is available to determine safety of driving.   If you are interested in the driving assessment, you can contact the following:  [facility] in [place] [phone]  [facility] [phone]  [facility] [phone] or [phone]    Mediterranean Diet A Mediterranean diet refers to food and lifestyle choices that are based on the traditions of countries located on the [place]. This way of eating has been shown to help prevent certain conditions and improve outcomes for people who have chronic diseases, like kidney disease and heart disease. What are tips for following this plan? Lifestyle  Cook and eat meals together with your family, when possible. Drink enough fluid to keep your urine clear or pale yellow. Be physically active every day. This includes: Aerobic exercise like running or swimming. Leisure activities like gardening, walking, or housework. Get 7-8 hours of sleep each night. If recommended by your health care provider, drink red wine in moderation. This means 1 glass a day for nonpregnant women and 2 glasses a day for men. A glass of wine equals 5 oz (150 mL). Reading food labels  Check the serving size of packaged foods. For foods such as rice and pasta, the serving size refers to the amount of cooked product, not dry. Check the total fat in packaged foods. Avoid foods that have saturated fat or trans fats. Check the ingredients list for added sugars, such as corn syrup. Shopping  At the grocery store, buy most of your food from the areas near the walls of the store. This  includes: Fresh fruits and vegetables (produce). Grains, beans, nuts, and seeds. Some of these may be available in unpackaged forms or large amounts (in bulk). Fresh seafood. Poultry and eggs. Low-fat dairy products. Buy whole ingredients instead of prepackaged foods. Buy fresh fruits and vegetables in-season from local farmers markets. Buy frozen fruits and vegetables in resealable bags. If you do not have access to quality fresh seafood, buy precooked frozen shrimp or canned fish, such as tuna, salmon, or sardines. Buy small amounts of raw or cooked vegetables, salads, or olives from the deli or salad bar at your store. Stock your pantry so you always have certain foods on hand, such as olive oil, canned tuna, canned tomatoes, rice, pasta, and beans. Cooking  Cook foods with extra-virgin olive oil instead of using butter or other vegetable oils. Have meat as a side dish, and have vegetables or grains as your main dish. This means having meat in small portions or adding small amounts of meat to foods like pasta or stew. Use beans or vegetables instead of meat in common dishes like chili or lasagna. Experiment with different cooking methods. Try roasting or broiling vegetables instead of steaming or sauteing them. Add frozen vegetables to soups, stews, pasta, or rice. Add nuts or seeds for added healthy fat at each meal. You can add these to yogurt, salads, or vegetable dishes. Marinate fish  or vegetables using olive oil, lemon juice, garlic, and fresh herbs. Meal planning  Plan to eat 1 vegetarian meal one day each week. Try to work up to 2 vegetarian meals, if possible. Eat seafood 2 or more times a week. Have healthy snacks readily available, such as: Vegetable sticks with hummus. Greek yogurt. Fruit and nut trail mix. Eat balanced meals throughout the week. This includes: Fruit: 2-3 servings a day Vegetables: 4-5 servings a day Low-fat dairy: 2 servings a day Fish, poultry, or  lean meat: 1 serving a day Beans and legumes: 2 or more servings a week Nuts and seeds: 1-2 servings a day Whole grains: 6-8 servings a day Extra-virgin olive oil: 3-4 servings a day Limit red meat and sweets to only a few servings a month What are my food choices? Mediterranean diet Recommended Grains: Whole-grain pasta. Brown rice. Bulgar wheat. Polenta. Couscous. Whole-wheat bread. Orpah Cobb. Vegetables: Artichokes. Beets. Broccoli. Cabbage. Carrots. Eggplant. Green beans. Chard. Kale. Spinach. Onions. Leeks. Peas. Squash. Tomatoes. Peppers. Radishes. Fruits: Apples. Apricots. Avocado. Berries. Bananas. Cherries. Dates. Figs. Grapes. Lemons. Melon. Oranges. Peaches. Plums. Pomegranate. Meats and other protein foods: Beans. Almonds. Sunflower seeds. Pine nuts. Peanuts. Cod. Salmon. Scallops. Shrimp. Tuna. Tilapia. Clams. Oysters. Eggs. Dairy: Low-fat milk. Cheese. Greek yogurt. Beverages: Water. Red wine. Herbal tea. Fats and oils: Extra virgin olive oil. Avocado oil. Grape seed oil. Sweets and desserts: [place] yogurt with honey. Baked apples. Poached pears. Trail mix. Seasoning and other foods: Basil. Cilantro. Coriander. Cumin. Mint. Parsley. Sage. Rosemary. Tarragon. Garlic. Oregano. Thyme. Pepper. Balsalmic vinegar. Tahini. Hummus. Tomato sauce. Olives. Mushrooms. Limit these Grains: Prepackaged pasta or rice dishes. Prepackaged cereal with added sugar. Vegetables: Deep fried potatoes (french fries). Fruits: Fruit canned in syrup. Meats and other protein foods: Beef. Pork. Lamb. Poultry with skin. Hot dogs. Tomasa Blase. Dairy: Ice cream. Sour cream. Whole milk. Beverages: Juice. Sugar-sweetened soft drinks. Beer. Liquor and spirits. Fats and oils: Butter. Canola oil. Vegetable oil. Beef fat (tallow). Lard. Sweets and desserts: Cookies. Cakes. Pies. Candy. Seasoning and other foods: Mayonnaise. Premade sauces and marinades. The items listed may not be a complete list. Talk with your  dietitian about what dietary choices are right for you. Summary The Mediterranean diet includes both food and lifestyle choices. Eat a variety of fresh fruits and vegetables, beans, nuts, seeds, and whole grains. Limit the amount of red meat and sweets that you eat. Talk with your health care provider about whether it is safe for you to drink red wine in moderation. This means 1 glass a day for nonpregnant women and 2 glasses a day for men. A glass of wine equals 5 oz (150 mL). This information is not intended to replace advice given to you by your health care provider. Make sure you discuss any questions you have with your health care provider. Document Released: 06/28/2016 Document Revised: 07/31/2016 Document Reviewed: 06/28/2016 Elsevier Interactive Patient Education  2017 [employer].

## 2023-06-28 ENCOUNTER — Telehealth: Payer: Self-pay

## 2023-06-28 NOTE — Telephone Encounter (Signed)
Sent [facility] authorization, awaiting 06/28/2023.faxed to [phone]

## 2023-12-19 ENCOUNTER — Ambulatory Visit (INDEPENDENT_AMBULATORY_CARE_PROVIDER_SITE_OTHER): Payer: Non-veteran care | Admitting: Physician Assistant

## 2023-12-19 ENCOUNTER — Encounter: Payer: Self-pay | Admitting: Physician Assistant

## 2023-12-19 VITALS — BP 110/76 | HR 90 | Resp 18 | Wt 169.0 lb

## 2023-12-19 DIAGNOSIS — R413 Other amnesia: Secondary | ICD-10-CM | POA: Diagnosis not present
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
## 2023-12-19 NOTE — Patient Instructions (Signed)
It was a pleasure to see you today at our office.   Recommendations:   Recommend MRI head  Follow up in 6 month Recommend resume  Psychotherapy  Discontinue tobacco    Whom to call:  Memory  decline, memory medications: Call our office [phone]   For psychiatric meds, mood meds: Please have your primary care physician manage these medications.      For assessment of decision of mental capacity and competency:  Call Dr. Erick Blinks, geriatric psychiatrist at [phone]  For guidance in geriatric dementia issues please call [facility] [phone]    If you have any severe symptoms of a stroke, or other severe issues such as confusion,severe chills or fever, etc call 911 or go to the       RECOMMENDATIONS FOR ALL PATIENTS WITH MEMORY PROBLEMS: 1. Continue to exercise (Recommend 30 minutes of walking everyday, or 3 hours every week) 2. Increase social interactions - continue going to [place] and enjoy social gatherings with friends and family 3. Eat healthy, avoid fried foods and eat more fruits and vegetables 4. Maintain adequate blood pressure, blood sugar, and blood cholesterol level. Reducing the risk of stroke and cardiovascular disease also helps promoting better memory. 5. Avoid stressful situations. Live a simple life and avoid aggravations. Organize your time and prepare for the next day in anticipation. 6. Sleep well, avoid any interruptions of sleep and avoid any distractions in the bedroom that may interfere with adequate sleep quality 7. Avoid sugar, avoid sweets as there is a strong link between excessive sugar intake, diabetes, and cognitive impairment We discussed the Mediterranean diet, which has been shown to help patients reduce the risk of progressive memory disorders and reduces cardiovascular risk. This includes eating fish, eat fruits and green leafy vegetables, nuts like almonds and hazelnuts, walnuts, and also use olive oil. Avoid fast  foods and fried foods as much as possible. Avoid sweets and sugar as sugar use has been linked to worsening of memory function.  There is always a concern of gradual progression of memory problems. If this is the case, then we may need to adjust level of care according to patient needs. Support, both to the patient and caregiver, should then be put into place.        FALL PRECAUTIONS: Be cautious when walking. Scan the area for obstacles that may increase the risk of trips and falls. When getting up in the mornings, sit up at the edge of the bed for a few minutes before getting out of bed. Consider elevating the bed at the head end to avoid drop of blood pressure when getting up. Walk always in a well-lit room (use night lights in the walls). Avoid area rugs or power cords from appliances in the middle of the walkways. Use a walker or a cane if necessary and consider physical therapy for balance exercise. Get your eyesight checked regularly.  FINANCIAL OVERSIGHT: Supervision, especially oversight when making financial decisions or transactions is also recommended.  HOME SAFETY: Consider the safety of the kitchen when operating appliances like stoves, microwave oven, and blender. Consider having supervision and share cooking responsibilities until no longer able to participate in those. Accidents with firearms and other hazards in the house should be identified and addressed as well.   ABILITY TO BE LEFT ALONE: If patient is unable to contact 911 operator, consider using LifeLine, or when the need is there, arrange for someone to stay with patients. Smoking is a Air cabin crew hazard,  consider supervision or cessation. Risk of wandering should be assessed by caregiver and if detected at any point, supervision and safe proof recommendations should be instituted.  MEDICATION SUPERVISION: Inability to self-administer medication needs to be constantly addressed. Implement a mechanism to ensure safe administration  of the medications.   DRIVING: Regarding driving, in patients with progressive memory problems, driving will be impaired. We advise to have someone else do the driving if trouble finding directions or if minor accidents are reported. Independent driving assessment is available to determine safety of driving.   If you are interested in the driving assessment, you can contact the following:  [facility] in [place] [phone]  [facility] [phone]  [facility] [phone] or [phone]    Mediterranean Diet A Mediterranean diet refers to food and lifestyle choices that are based on the traditions of countries located on the [employer] [place]. This way of eating has been shown to help prevent certain conditions and improve outcomes for people who have chronic diseases, like kidney disease and heart disease. What are tips for following this plan? Lifestyle  Cook and eat meals together with your family, when possible. Drink enough fluid to keep your urine clear or pale yellow. Be physically active every day. This includes: Aerobic exercise like running or swimming. Leisure activities like gardening, walking, or housework. Get 7-8 hours of sleep each night. If recommended by your health care provider, drink red wine in moderation. This means 1 glass a day for nonpregnant women and 2 glasses a day for men. A glass of wine equals 5 oz (150 mL). Reading food labels  Check the serving size of packaged foods. For foods such as rice and pasta, the serving size refers to the amount of cooked product, not dry. Check the total fat in packaged foods. Avoid foods that have saturated fat or trans fats. Check the ingredients list for added sugars, such as corn syrup. Shopping  At the grocery store, buy most of your food from the areas near the walls of the store. This includes: Fresh fruits and vegetables  (produce). Grains, beans, nuts, and seeds. Some of these may be available in unpackaged forms or large amounts (in bulk). Fresh seafood. Poultry and eggs. Low-fat dairy products. Buy whole ingredients instead of prepackaged foods. Buy fresh fruits and vegetables in-season from local farmers markets. Buy frozen fruits and vegetables in resealable bags. If you do not have access to quality fresh seafood, buy precooked frozen shrimp or canned fish, such as tuna, salmon, or sardines. Buy small amounts of raw or cooked vegetables, salads, or olives from the deli or salad bar at your store. Stock your pantry so you always have certain foods on hand, such as olive oil, canned tuna, canned tomatoes, rice, pasta, and beans. Cooking  Cook foods with extra-virgin olive oil instead of using butter or other vegetable oils. Have meat as a side dish, and have vegetables or grains as your main dish. This means having meat in small portions or adding small amounts of meat to foods like pasta or stew. Use beans or vegetables instead of meat in common dishes like chili or lasagna. Experiment with different cooking methods. Try roasting or broiling vegetables instead of steaming or sauteing them. Add frozen vegetables to soups, stews, pasta, or rice. Add nuts or seeds for added healthy fat at each meal. You can add these to yogurt, salads, or vegetable dishes. Marinate fish or vegetables using olive oil,  lemon juice, garlic, and fresh herbs. Meal planning  Plan to eat 1 vegetarian meal one day each week. Try to work up to 2 vegetarian meals, if possible. Eat seafood 2 or more times a week. Have healthy snacks readily available, such as: Vegetable sticks with hummus. Greek yogurt. Fruit and nut trail mix. Eat balanced meals throughout the week. This includes: Fruit: 2-3 servings a day Vegetables: 4-5 servings a day Low-fat dairy: 2 servings a day Fish, poultry, or lean meat: 1 serving a day Beans and  legumes: 2 or more servings a week Nuts and seeds: 1-2 servings a day Whole grains: 6-8 servings a day Extra-virgin olive oil: 3-4 servings a day Limit red meat and sweets to only a few servings a month What are my food choices? Mediterranean diet Recommended Grains: Whole-grain pasta. Brown rice. Bulgar wheat. Polenta. Couscous. Whole-wheat bread. Orpah Cobb. Vegetables: Artichokes. Beets. Broccoli. Cabbage. Carrots. Eggplant. Green beans. Chard. Kale. Spinach. Onions. Leeks. Peas. Squash. Tomatoes. Peppers. Radishes. Fruits: Apples. Apricots. Avocado. Berries. Bananas. Cherries. Dates. Figs. Grapes. Lemons. Melon. Oranges. Peaches. Plums. Pomegranate. Meats and other protein foods: Beans. Almonds. Sunflower seeds. Pine nuts. Peanuts. Cod. Salmon. Scallops. Shrimp. Tuna. Tilapia. Clams. Oysters. Eggs. Dairy: Low-fat milk. Cheese. Greek yogurt. Beverages: Water. Red wine. Herbal tea. Fats and oils: Extra virgin olive oil. Avocado oil. Grape seed oil. Sweets and desserts: [place] yogurt with honey. Baked apples. Poached pears. Trail mix. Seasoning and other foods: Basil. Cilantro. Coriander. Cumin. Mint. Parsley. Sage. Rosemary. Tarragon. Garlic. Oregano. Thyme. Pepper. Balsalmic vinegar. Tahini. Hummus. Tomato sauce. Olives. Mushrooms. Limit these Grains: Prepackaged pasta or rice dishes. Prepackaged cereal with added sugar. Vegetables: Deep fried potatoes (french fries). Fruits: Fruit canned in syrup. Meats and other protein foods: Beef. Pork. Lamb. Poultry with skin. Hot dogs. Tomasa Blase. Dairy: Ice cream. Sour cream. Whole milk. Beverages: Juice. Sugar-sweetened soft drinks. Beer. Liquor and spirits. Fats and oils: Butter. Canola oil. Vegetable oil. Beef fat (tallow). Lard. Sweets and desserts: Cookies. Cakes. Pies. Candy. Seasoning and other foods: Mayonnaise. Premade sauces and marinades. The items listed may not be a complete list. Talk with your dietitian about what dietary choices  are right for you. Summary The Mediterranean diet includes both food and lifestyle choices. Eat a variety of fresh fruits and vegetables, beans, nuts, seeds, and whole grains. Limit the amount of red meat and sweets that you eat. Talk with your health care provider about whether it is safe for you to drink red wine in moderation. This means 1 glass a day for nonpregnant women and 2 glasses a day for men. A glass of wine equals 5 oz (150 mL). This information is not intended to replace advice given to you by your health care provider. Make sure you discuss any questions you have with your health care provider. Document Released: 06/28/2016 Document Revised: 07/31/2016 Document Reviewed: 06/28/2016 Elsevier Interactive Patient Education  2017 [employer].

## 2023-12-20 ENCOUNTER — Telehealth: Payer: Self-pay | Admitting: Physician Assistant

## 2023-12-20 NOTE — Telephone Encounter (Signed)
[facility] called to let sara know the patient has a pacemaker and needs to be scheduled at the hospital or another capable facility. They did not leave name of person calling just said [facility]

## 2023-12-23 ENCOUNTER — Other Ambulatory Visit: Payer: Self-pay

## 2023-12-23 DIAGNOSIS — R413 Other amnesia: Secondary | ICD-10-CM

## 2023-12-23 NOTE — Telephone Encounter (Signed)
Done and sent to [facility] for scheduling

## 2023-12-23 NOTE — Telephone Encounter (Signed)
Will do today

## 2023-12-25 ENCOUNTER — Encounter (HOSPITAL_COMMUNITY): Payer: Self-pay

## 2023-12-25 ENCOUNTER — Other Ambulatory Visit: Payer: Self-pay

## 2023-12-25 ENCOUNTER — Telehealth: Payer: Self-pay | Admitting: Physician Assistant

## 2023-12-25 DIAGNOSIS — R413 Other amnesia: Secondary | ICD-10-CM

## 2023-12-25 NOTE — Telephone Encounter (Signed)
 We had to change to CT head, sending to [facility] will call patient back. thanks

## 2023-12-25 NOTE — Telephone Encounter (Signed)
 Pt. Cb missed cal from [place]

## 2024-01-16 ENCOUNTER — Encounter (HOSPITAL_COMMUNITY): Payer: Self-pay | Admitting: Radiology

## 2024-01-20 ENCOUNTER — Ambulatory Visit
Admission: RE | Admit: 2024-01-20 | Discharge: 2024-01-20 | Disposition: A | Discharge status: Home / Self Care | Payer: Medicare Other | Source: Ambulatory Visit | Attending: Physician Assistant | Admitting: Physician Assistant

## 2024-04-15 ENCOUNTER — Encounter: Payer: Self-pay | Admitting: Physician Assistant

## 2024-05-21 ENCOUNTER — Telehealth: Payer: Self-pay

## 2024-05-21 NOTE — Telephone Encounter (Signed)
 Update on [place] referral.  I alerted PCP and sent him a request.  An internal consult was placed and scheduled. A new CC consult has not been issued at this time. From [facility] at [employer]. Will follow on this

## 2024-06-10 ENCOUNTER — Ambulatory Visit: Payer: Medicare Other | Admitting: Physician Assistant

## 2024-06-18 ENCOUNTER — Ambulatory Visit: Payer: Medicare Other | Admitting: Physician Assistant

## 2024-08-05 ENCOUNTER — Ambulatory Visit: Admitting: Physician Assistant

## 2024-10-14 ENCOUNTER — Encounter: Payer: Self-pay | Admitting: Psychology

## 2024-12-08 ENCOUNTER — Encounter: Admitting: Psychology

## 2025-02-03 ENCOUNTER — Encounter: Admitting: Psychology
